# Patient Record
Sex: Female | Born: 1980 | Race: White | Hispanic: No | Marital: Single | Smoking: Never smoker
Health system: Southern US, Community
[De-identification: ages and names within clinical notes are randomized; demographics above are authoritative.]

## PROBLEM LIST (undated history)

## (undated) DIAGNOSIS — F410 Panic disorder [episodic paroxysmal anxiety] without agoraphobia: Secondary | ICD-10-CM

## (undated) DIAGNOSIS — F419 Anxiety disorder, unspecified: Secondary | ICD-10-CM

## (undated) DIAGNOSIS — F32A Depression, unspecified: Secondary | ICD-10-CM

## (undated) DIAGNOSIS — K219 Gastro-esophageal reflux disease without esophagitis: Secondary | ICD-10-CM

## (undated) DIAGNOSIS — Z872 Personal history of diseases of the skin and subcutaneous tissue: Secondary | ICD-10-CM

## (undated) DIAGNOSIS — F329 Major depressive disorder, single episode, unspecified: Secondary | ICD-10-CM

## (undated) HISTORY — DX: Depression, unspecified: F32.A

## (undated) HISTORY — DX: Personal history of diseases of the skin and subcutaneous tissue: Z87.2

## (undated) HISTORY — DX: Panic disorder (episodic paroxysmal anxiety): F41.0

## (undated) HISTORY — PX: KELOID EXCISION: SHX1856

## (undated) HISTORY — DX: Major depressive disorder, single episode, unspecified: F32.9

## (undated) HISTORY — DX: Anxiety disorder, unspecified: F41.9

## (undated) HISTORY — DX: Gastro-esophageal reflux disease without esophagitis: K21.9

---

## 2004-06-19 ENCOUNTER — Ambulatory Visit: Payer: Self-pay | Admitting: Family Medicine

## 2004-12-11 ENCOUNTER — Ambulatory Visit: Payer: Self-pay | Admitting: Family Medicine

## 2004-12-14 ENCOUNTER — Ambulatory Visit: Payer: Self-pay | Admitting: Family Medicine

## 2005-05-30 ENCOUNTER — Ambulatory Visit: Payer: Self-pay | Admitting: Specialist

## 2005-07-04 ENCOUNTER — Ambulatory Visit: Payer: Self-pay | Admitting: Specialist

## 2005-08-30 ENCOUNTER — Ambulatory Visit: Payer: Self-pay | Admitting: Family Medicine

## 2005-08-30 ENCOUNTER — Other Ambulatory Visit: Admission: RE | Admit: 2005-08-30 | Discharge: 2005-08-30 | Payer: Self-pay | Admitting: Family Medicine

## 2005-09-09 ENCOUNTER — Ambulatory Visit: Payer: Self-pay | Admitting: Family Medicine

## 2005-11-11 ENCOUNTER — Ambulatory Visit: Payer: Self-pay | Admitting: Family Medicine

## 2005-11-18 ENCOUNTER — Ambulatory Visit: Payer: Self-pay | Admitting: Family Medicine

## 2006-03-17 ENCOUNTER — Ambulatory Visit: Payer: Self-pay | Admitting: Family Medicine

## 2006-05-26 ENCOUNTER — Ambulatory Visit: Payer: Self-pay | Admitting: Family Medicine

## 2006-06-25 ENCOUNTER — Ambulatory Visit: Payer: Self-pay | Admitting: Family Medicine

## 2006-06-25 DIAGNOSIS — Z8742 Personal history of other diseases of the female genital tract: Secondary | ICD-10-CM

## 2006-06-25 LAB — CONVERTED CEMR LAB
Bacteria, UA: 0
Ketones, urine, test strip: NEGATIVE
Mucus, UA: 0
Nitrite: NEGATIVE
Protein, U semiquant: NEGATIVE
RBC / HPF: 0
Urobilinogen, UA: NEGATIVE
WBC, UA: 0 cells/hpf

## 2006-06-26 ENCOUNTER — Encounter: Payer: Self-pay | Admitting: Family Medicine

## 2006-06-27 ENCOUNTER — Encounter: Payer: Self-pay | Admitting: Family Medicine

## 2006-06-27 ENCOUNTER — Ambulatory Visit: Payer: Self-pay | Admitting: Internal Medicine

## 2006-06-27 LAB — CONVERTED CEMR LAB: Chlamydia, DNA Probe: NEGATIVE

## 2006-09-12 ENCOUNTER — Encounter: Payer: Self-pay | Admitting: Family Medicine

## 2006-09-12 DIAGNOSIS — F3289 Other specified depressive episodes: Secondary | ICD-10-CM | POA: Insufficient documentation

## 2006-09-12 DIAGNOSIS — F329 Major depressive disorder, single episode, unspecified: Secondary | ICD-10-CM

## 2006-09-12 DIAGNOSIS — K219 Gastro-esophageal reflux disease without esophagitis: Secondary | ICD-10-CM

## 2006-10-21 ENCOUNTER — Ambulatory Visit: Payer: Self-pay | Admitting: Family Medicine

## 2006-10-27 ENCOUNTER — Ambulatory Visit: Payer: Self-pay | Admitting: Family Medicine

## 2006-10-31 ENCOUNTER — Encounter: Payer: Self-pay | Admitting: Family Medicine

## 2006-11-04 ENCOUNTER — Encounter: Payer: Self-pay | Admitting: Family Medicine

## 2006-12-16 ENCOUNTER — Other Ambulatory Visit: Admission: RE | Admit: 2006-12-16 | Discharge: 2006-12-16 | Payer: Self-pay | Admitting: Family Medicine

## 2006-12-16 ENCOUNTER — Ambulatory Visit: Payer: Self-pay | Admitting: Family Medicine

## 2006-12-16 ENCOUNTER — Encounter: Payer: Self-pay | Admitting: Family Medicine

## 2006-12-16 DIAGNOSIS — F410 Panic disorder [episodic paroxysmal anxiety] without agoraphobia: Secondary | ICD-10-CM

## 2006-12-22 ENCOUNTER — Encounter (INDEPENDENT_AMBULATORY_CARE_PROVIDER_SITE_OTHER): Payer: Self-pay | Admitting: *Deleted

## 2006-12-22 LAB — CONVERTED CEMR LAB: Pap Smear: NORMAL

## 2006-12-29 ENCOUNTER — Encounter: Payer: Self-pay | Admitting: Family Medicine

## 2007-01-05 ENCOUNTER — Ambulatory Visit: Payer: Self-pay | Admitting: Family Medicine

## 2007-01-12 ENCOUNTER — Encounter: Payer: Self-pay | Admitting: Family Medicine

## 2007-01-27 ENCOUNTER — Ambulatory Visit: Payer: Self-pay | Admitting: Family Medicine

## 2007-11-20 ENCOUNTER — Encounter (INDEPENDENT_AMBULATORY_CARE_PROVIDER_SITE_OTHER): Payer: Self-pay | Admitting: *Deleted

## 2008-02-19 ENCOUNTER — Other Ambulatory Visit: Admission: RE | Admit: 2008-02-19 | Discharge: 2008-02-19 | Payer: Self-pay | Admitting: Family Medicine

## 2008-02-19 ENCOUNTER — Encounter: Payer: Self-pay | Admitting: Family Medicine

## 2008-02-19 ENCOUNTER — Ambulatory Visit: Payer: Self-pay | Admitting: Family Medicine

## 2008-02-23 ENCOUNTER — Encounter (INDEPENDENT_AMBULATORY_CARE_PROVIDER_SITE_OTHER): Payer: Self-pay | Admitting: *Deleted

## 2008-11-01 ENCOUNTER — Telehealth: Payer: Self-pay | Admitting: Family Medicine

## 2009-02-10 ENCOUNTER — Telehealth: Payer: Self-pay | Admitting: Family Medicine

## 2009-03-08 ENCOUNTER — Telehealth: Payer: Self-pay | Admitting: Family Medicine

## 2009-03-29 ENCOUNTER — Ambulatory Visit: Payer: Self-pay | Admitting: Family Medicine

## 2009-03-29 ENCOUNTER — Other Ambulatory Visit: Admission: RE | Admit: 2009-03-29 | Discharge: 2009-03-29 | Payer: Self-pay | Admitting: Family Medicine

## 2009-03-31 LAB — CONVERTED CEMR LAB
BUN: 8 mg/dL (ref 6–23)
Basophils Absolute: 0.1 10*3/uL (ref 0.0–0.1)
Cholesterol: 182 mg/dL (ref 0–200)
GFR calc non Af Amer: 158.24 mL/min (ref >60)
Glucose, Bld: 90 mg/dL (ref 70–99)
HCT: 38 % (ref 36.0–46.0)
HDL: 79.8 mg/dL (ref >39.00)
Lymphs Abs: 1.4 10*3/uL (ref 0.7–4.0)
MCV: 92.7 fL (ref 78.0–100.0)
Monocytes Absolute: 0.3 10*3/uL (ref 0.1–1.0)
Monocytes Relative: 6 % (ref 3.0–12.0)
Platelets: 226 10*3/uL (ref 150.0–400.0)
Potassium: 3.8 meq/L (ref 3.5–5.1)
RDW: 12.8 % (ref 11.5–14.6)
TSH: 0.76 microintl units/mL (ref 0.35–5.50)
Total Bilirubin: 0.5 mg/dL (ref 0.3–1.2)
VLDL: 22.6 mg/dL (ref 0.0–40.0)

## 2009-04-10 ENCOUNTER — Encounter: Payer: Self-pay | Admitting: Family Medicine

## 2009-04-10 LAB — CONVERTED CEMR LAB

## 2009-04-11 ENCOUNTER — Encounter: Payer: Self-pay | Admitting: Family Medicine

## 2009-04-11 LAB — CONVERTED CEMR LAB: Pap Smear: UNDETERMINED

## 2010-03-06 NOTE — Letter (Signed)
Summary: Results Follow up Letter  [place] [facility]  [address] [place], [place] [address]   Phone: [phone]  Fax: [phone]    04/11/2009 MRN: [id number]    [facility] [address] [place], [place]  [address]    Dear Ms. Musolf,  The following are the results of your recent test(s):  Test         Result    Pap Smear:        Normal _____  Not Normal _____ Comments:Pap smear was the same with atypical cells but no change. In light of the fact that HPV is negative can wait on next pap for one year. ______________________________________________________ Cholesterol: LDL(Bad cholesterol):         Your goal is less than:         HDL (Good cholesterol):       Your goal is more than: Comments:  ______________________________________________________ Mammogram:        Normal _____  Not Normal _____ Comments:  ___________________________________________________________________ Hemoccult:        Normal _____  Not normal _______ Comments:    _____________________________________________________________________ Other Tests:    We routinely do not discuss normal results over the telephone.  If you desire a copy of the results, or you have any questions about this information we can discuss them at your next office visit.   Sincerely,    Idamae Schuller Jarel Cuadra,MD  MT/ri

## 2010-03-06 NOTE — Progress Notes (Signed)
Summary: ORTHO TRI-CYCLEN LO 0.025 MG TABS  Phone Note Refill Request Message from:  Patient on February 10, 2009 4:02 PM  Refills Requested: Medication #1:  ORTHO TRI-CYCLEN LO 0.025 MG TABS take 1 by mouth once daily as directed Patient sayst that she has an appointment with you it Feb., want to know if she can get a refill on birth control until she comes in to see you.  Would like it sent to [facility] on [address].    Method Requested: Electronic Initial call taken by: Melody Comas,  February 10, 2009 4:04 PM Caller: Patient Call For: Kerby Nora MD  Follow-up for Phone Call        Rx called to pharmacy and patient notifed.   Follow-up by: Linde Gillis CMA Duncan Dull),  February 10, 2009 4:38 PM

## 2010-03-06 NOTE — Progress Notes (Signed)
Summary: ORTHO TRI-CYCLEN LO 0.025 MG TABS  Phone Note Refill Request Call back at Home Phone [phone] Message from:  Patient on March 08, 2009 12:03 PM  Refills Requested: Medication #1:  ORTHO TRI-CYCLEN LO 0.025 MG TABS take 1 by mouth once daily as directed Patient would like it sent to [facility] on [address].   Initial call taken by: Melody Comas,  March 08, 2009 12:04 PM  Follow-up for Phone Call        Med called to [facility] [address][phone]. One pack only. Pt has appt scheduled 03/29/09. Lewanda Rife LPN  March 08, 2009 4:51 PM

## 2010-03-06 NOTE — Assessment & Plan Note (Signed)
Summary: CPX W/PAP/RBH   Vital Signs:  Patient profile:   30 year old female Height:      65 inches Weight:      126.25 pounds BMI:     21.09 Temp:     98.6 degrees F oral Pulse rate:   80 / minute Pulse rhythm:   regular BP sitting:   132 / 84  (right arm) Cuff size:   regular  Vitals Entered By: Lewanda Rife LPN (March 29, 2009 9:26 AM)  History of Present Illness: here for health mt and gyn care   feeling good , no probls or new concerns   wt is stable with bmi of 21  bp 132/ 84  pap 1/10 normal  TD 03   periods are regular  last summer had some bad ones- better now  now not excessive cramping or heaviness  likes the ortho tri cyclen lo -- does not want to switch   wants to go ahead and do std screen with pap    takes good care of herself- diet and exercise  using her sunscreen no new family hx      Allergies: 1)  ! Sulfa 2)  ! * Adhesives  Past History:  Past Medical History: Last updated: 02/19/2008 Depression/anx/panic GERD cyst removal with scar revision (keloid) on arm     ENT- Gertie Baron   Past Surgical History: Last updated: 12/16/2006 I and D on arm for cyst/cellutitis  Family History: Last updated: 09/12/2006 Father:  Mother: MVP, allergies Siblings:   Social History: Last updated: 02/19/2008 Marital Status: single Children:  Occupation: works at [employer]  non smoker   Risk Factors: Smoking Status: never (09/12/2006)  Review of Systems General:  Denies fatigue, fever, loss of appetite, and malaise. Eyes:  Denies blurring and eye pain. CV:  Denies chest pain or discomfort, palpitations, and shortness of breath with exertion. Resp:  Denies cough and wheezing. GI:  Denies abdominal pain, bloody stools, change in bowel habits, and indigestion. GU:  Denies abnormal vaginal bleeding, discharge, dysuria, genital sores, and urinary frequency. MS:  Denies joint pain and cramps. Derm:  Denies lesion(s), poor wound  healing, and rash. Neuro:  Denies numbness and tingling. Psych:  Denies anxiety and depression. Endo:  Denies cold intolerance, excessive thirst, excessive urination, and heat intolerance. Heme:  Denies abnormal bruising and bleeding.  Physical Exam  General:  Well-developed,well-nourished,in no acute distress; alert,appropriate and cooperative throughout examination Head:  normocephalic, atraumatic, and no abnormalities observed.   Eyes:  vision grossly intact, pupils equal, pupils round, and pupils reactive to light.  no conjunctival pallor, injection or icterus  Ears:  R ear normal and L ear normal.   Nose:  no nasal discharge.   Mouth:  pharynx pink and moist.   Neck:  supple with full rom and no masses or thyromegally, no JVD or carotid bruit  Chest Wall:  No deformities, masses, or tenderness noted. Breasts:  No mass, nodules, thickening, tenderness, bulging, retraction, inflamation, nipple discharge or skin changes noted.   Lungs:  Normal respiratory effort, chest expands symmetrically. Lungs are clear to auscultation, no crackles or wheezes. Heart:  Normal rate and regular rhythm. S1 and S2 normal without gallop, murmur, click, rub or other extra sounds. Abdomen:  Bowel sounds positive,abdomen soft and non-tender without masses, organomegaly or hernias noted.  Msk:  No deformity or scoliosis noted of thoracic or lumbar spine.  no acute joint changes  Pulses:  R and L carotid,radial,femoral,dorsalis pedis and  posterior tibial pulses are full and equal bilaterally Extremities:  No clubbing, cyanosis, edema, or deformity noted with normal full range of motion of all joints.   Neurologic:  sensation intact to light touch, gait normal, and DTRs symmetrical and normal.   Skin:  Intact without suspicious lesions or rashes Cervical Nodes:  No lymphadenopathy noted Axillary Nodes:  No palpable lymphadenopathy Inguinal Nodes:  No significant adenopathy Psych:  normal affect, talkative and  pleasant    Impression & Recommendations:  Problem # 1:  HEALTH MAINTENANCE EXAM (ICD-V70.0) Assessment Comment Only  reviewed health habits including diet, exercise and skin cancer prevention reviewed health maintenance list and family history  wellness labs today Orders: Venipuncture (04540) TLB-Lipid Panel (80061-LIPID) TLB-BMP (Basic Metabolic Panel-BMET) (80048-METABOL) TLB-CBC Platelet - w/Differential (85025-CBCD) TLB-Hepatic/Liver Function Pnl (80076-HEPATIC) TLB-TSH (Thyroid Stimulating Hormone) (84443-TSH)  Problem # 2:  ROUTINE GYNECOLOGICAL EXAMINATION (ICD-V72.31) Assessment: Comment Only no change in OC - doing well disc std screen  pap today   Problem # 3:  SCREENING EXAMINATION FOR VENEREAL DISEASE (ICD-V74.5) Assessment: Comment Only no symptoms - just wants screening  add gon and chlam test to pap   Complete Medication List: 1)  Ortho Tri-cyclen Lo 0.025 Mg Tabs (Norgestimate-ethinyl estradiol) .... Take 1 by mouth once daily as directed 2)  Proventil Hfa 108 (90 Base) Mcg/act Aers (Albuterol sulfate) .... 2 puffs up to every 4 hours  as needed for wheeezing 3)  Calcium Carbonate-vitamin D 600-400 Mg-unit Tabs (Calcium carbonate-vitamin d) .... Take one twice a day as needed  Patient Instructions: 1)  no change in ortho try cyclen  2)  keep up the good work with diet and exercise  3)  use your sunscreen  4)  will update you when results return Prescriptions: ORTHO TRI-CYCLEN LO 0.025 MG TABS (NORGESTIMATE-ETHINYL ESTRADIOL) take 1 by mouth once daily as directed  #1 pack x 11   Entered and Authorized by:   Judith Part MD   Signed by:   Judith Part MD on 03/29/2009   Method used:   Print then Give to Patient   RxID:   [id number]   Current Allergies (reviewed today): ! SULFA ! * ADHESIVES

## 2010-03-06 NOTE — Miscellaneous (Signed)
Summary: pap smear screening  Clinical Lists Changes  Observations: Added new observation of PAP DUE: 04/2010 (04/11/2009 17:08) Added new observation of PAP SMEAR: ascus pap (04/10/2009 17:09)      Preventive Care Screening  Pap Smear:    Date:  04/10/2009    Next Due:  04/2010    Results:  ascus pap

## 2010-04-02 ENCOUNTER — Other Ambulatory Visit: Payer: Self-pay | Admitting: Family Medicine

## 2010-04-02 ENCOUNTER — Ambulatory Visit (INDEPENDENT_AMBULATORY_CARE_PROVIDER_SITE_OTHER)
Admission: RE | Admit: 2010-04-02 | Discharge: 2010-04-02 | Disposition: A | Discharge status: Home / Self Care | Payer: 59 | Source: Ambulatory Visit | Attending: Family Medicine | Admitting: Family Medicine

## 2010-04-02 ENCOUNTER — Other Ambulatory Visit (HOSPITAL_COMMUNITY)
Admission: RE | Admit: 2010-04-02 | Discharge: 2010-04-02 | Disposition: A | Discharge status: Home / Self Care | Payer: 59 | Source: Ambulatory Visit | Attending: Family Medicine | Admitting: Family Medicine

## 2010-04-02 ENCOUNTER — Encounter (INDEPENDENT_AMBULATORY_CARE_PROVIDER_SITE_OTHER): Payer: 59 | Admitting: Family Medicine

## 2010-04-02 ENCOUNTER — Encounter: Payer: Self-pay | Admitting: Family Medicine

## 2010-04-02 DIAGNOSIS — D492 Neoplasm of unspecified behavior of bone, soft tissue, and skin: Secondary | ICD-10-CM

## 2010-04-02 DIAGNOSIS — Z01419 Encounter for gynecological examination (general) (routine) without abnormal findings: Secondary | ICD-10-CM

## 2010-04-02 DIAGNOSIS — Z0000 Encounter for general adult medical examination without abnormal findings: Secondary | ICD-10-CM

## 2010-04-02 DIAGNOSIS — Z124 Encounter for screening for malignant neoplasm of cervix: Secondary | ICD-10-CM | POA: Insufficient documentation

## 2010-04-12 NOTE — Assessment & Plan Note (Signed)
Summary: CPX/ALC   Vital Signs:  Patient profile:   30 year old female Height:      65.5 inches Weight:      128.25 pounds BMI:     21.09 Temp:     99.4 degrees F oral Pulse rate:   88 / minute Pulse rhythm:   regular BP sitting:   130 / 82  (left arm) Cuff size:   regular  Vitals Entered By: Lewanda Rife LPN (April 02, 2010 10:44 AM) CC: CPX with pap and breast exam LMP 03/07/10   History of Present Illness: here for wellness exam and pap  is doing ok overall   has been phlemmy the last few ams - thinks she is getting a cold   has a knot on her leg - she wants me to look at --- this has been 2 mo -- did have injury there when younger is a little tender  was dx with granuloma annulare  given a steroid cream    last pap 1 year ago was ascus with neg hpv and neg std tests  not wanting std tests    on ortho tri cyclen lo- now is not affordible now  this is the only one she has been on    wt is up 2 lb with bmi of 21  130/82  bp   Td 03    Allergies: 1)  ! Sulfa 2)  ! * Adhesives  Past History:  Past Surgical History: Last updated: 12/16/2006 I and D on arm for cyst/cellutitis  Family History: Last updated: 09/12/2006 Father:  Mother: MVP, allergies Siblings:   Social History: Last updated: 02/19/2008 Marital Status: single Children:  Occupation: works at [employer]  non smoker   Risk Factors: Smoking Status: never (09/12/2006)  Past Medical History: Depression/anx/panic GERD cyst removal with scar revision (keloid) on arm  annuloma grannulare    ENT- [facility]  derm  Review of Systems General:  Denies chills, fatigue, fever, loss of appetite, and malaise. Eyes:  Denies blurring and eye irritation. ENT:  Complains of postnasal drainage; denies sinus pressure and sore throat. CV:  Denies palpitations and shortness of breath with exertion. Resp:  Denies cough, shortness of breath, and wheezing. GI:  Denies abdominal pain, change  in bowel habits, indigestion, and nausea. GU:  Denies abnormal vaginal bleeding, discharge, and dysuria. MS:  Denies muscle aches, cramps, and stiffness. Derm:  Denies itching, lesion(s), poor wound healing, and rash. Neuro:  Denies numbness and tingling. Psych:  mood is ok . Endo:  Denies cold intolerance, excessive thirst, excessive urination, and heat intolerance. Heme:  Denies abnormal bruising and bleeding.  Physical Exam  General:  Well-developed,well-nourished,in no acute distress; alert,appropriate and cooperative throughout examination Head:  normocephalic, atraumatic, and no abnormalities observed.   Eyes:  vision grossly intact, pupils equal, pupils round, and pupils reactive to light.  no conjunctival pallor, injection or icterus  Ears:  R ear normal and L ear normal.   Nose:  no nasal discharge.   Mouth:  pharynx pink and moist.   Neck:  supple with full rom and no masses or thyromegally, no JVD or carotid bruit  Chest Wall:  No deformities, masses, or tenderness noted. Breasts:  No mass, nodules, thickening, tenderness, bulging, retraction, inflamation, nipple discharge or skin changes noted.   Lungs:  Normal respiratory effort, chest expands symmetrically. Lungs are clear to auscultation, no crackles or wheezes. Heart:  Normal rate and regular rhythm. S1 and S2 normal without  gallop, murmur, click, rub or other extra sounds. Abdomen:  Bowel sounds positive,abdomen soft and non-tender without masses, organomegaly or hernias noted. no renal bruits  Genitalia:  Normal introitus for age, no external lesions, no vaginal discharge, mucosa pink and moist, no vaginal or cervical lesions, no vaginal atrophy, no friaility or hemorrhage, normal uterus size and position, no adnexal masses or tenderness Msk:  No deformity or scoliosis noted of thoracic or lumbar spine.  no acute joint changes  Pulses:  R and L carotid,radial,femoral,dorsalis pedis and posterior tibial pulses are full and  equal bilaterally Extremities:  No clubbing, cyanosis, edema, or deformity noted with normal full range of motion of all joints.   Neurologic:  sensation intact to light touch, gait normal, and DTRs symmetrical and normal.   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 2 puffs up to every 4 hours  as needed for wheeezing 2)  Vitamin C 500 Mg Tabs (Ascorbic acid) .... Take 1 tablet by mouth once a day 3)  Clobetasole Cream 0.05%  .... Apply to both legs twice weekly as needed. 4)  Aviane 0.1-20 Mg-mcg Tabs (Levonorgestrel-ethinyl estrad) .Marland Kitchen.. 1 by mouth once daily as directed  Patient Instructions: 1)  try zinc losenges otc for 3 days (I like the hall's complete)  2)  change to the aviane oral contraceptive -- give your body 3 months to adjust - but if side effects let me know  3)  we will do leg x ray on way out today  Prescriptions: AVIANE 0.1-20 MG-MCG TABS (LEVONORGESTREL-ETHINYL ESTRAD) 1  by mouth once daily as directed  #1 pack x 11   Entered and Authorized by:   Judith Part MD   Signed by:   Judith Part MD on 04/02/2010   Method used:   Electronically to        [facility] [phone]* (retail)       [address]       [place], [place]  [address]       Ph: [phone]       Fax:    RxID:   [id number]    Orders Added: 1)  Pap Smear, Thin Prep ( Collection of) [Q0091] 2)  T-Tib/Fib Left [73590TC] 3)  Prescription Created Electronically [[id number]] 4)  Est. Patient 18-39 years [99395] 5)  Est. Patient Level II [24401]    Current Allergies (reviewed today): ! SULFA ! * ADHESIVES

## 2010-05-05 ENCOUNTER — Encounter: Payer: Self-pay | Admitting: Family Medicine

## 2010-05-07 ENCOUNTER — Ambulatory Visit (INDEPENDENT_AMBULATORY_CARE_PROVIDER_SITE_OTHER): Payer: 59 | Admitting: Family Medicine

## 2010-05-07 ENCOUNTER — Encounter: Payer: Self-pay | Admitting: Family Medicine

## 2010-05-07 ENCOUNTER — Other Ambulatory Visit (HOSPITAL_COMMUNITY)
Admission: RE | Admit: 2010-05-07 | Discharge: 2010-05-07 | Disposition: A | Discharge status: Home / Self Care | Payer: 59 | Source: Ambulatory Visit | Attending: Family Medicine | Admitting: Family Medicine

## 2010-05-07 VITALS — BP 134/76 | HR 80 | Temp 99.2°F | Ht 65.5 in | Wt 127.8 lb

## 2010-05-07 DIAGNOSIS — Z01419 Encounter for gynecological examination (general) (routine) without abnormal findings: Secondary | ICD-10-CM | POA: Insufficient documentation

## 2010-05-07 DIAGNOSIS — R87619 Unspecified abnormal cytological findings in specimens from cervix uteri: Secondary | ICD-10-CM

## 2010-05-07 NOTE — Assessment & Plan Note (Signed)
Pap a year ago- ascus with neg hpv Pap last month lacked endocervical component  This is re check

## 2010-05-07 NOTE — Progress Notes (Signed)
  Subjective:    Patient ID: Veronica Andrews, female    DOB: 10/30/1980, 30 y.o.   MRN: [id number]  HPI  Here for f/u of pap -- last one neg but without endocervical component One before that was ascus without hpv Feels good  No gyn problems   New OC made her moody for a week - and then better peroids are ok   Past Medical History  Diagnosis Date  . Depression   . Anxiety   . Panic   . GERD (gastroesophageal reflux disease)   . History of keloid of skin     Past Surgical History  Procedure Date  . Keloid excision     with scar revision on arm    History   Social History  . Marital Status: Single    Spouse Name: N/A    Number of Children: N/A  . Years of Education: N/A   Occupational History  . works at [employer]    Social History Main Topics  . Smoking status: Never Smoker   . Smokeless tobacco: Not on file  . Alcohol Use: Not on file  . Drug Use: Not on file  . Sexually Active: Not on file   Other Topics Concern  . Not on file   Social History Narrative  . No narrative on file    Family History  Problem Relation Age of Onset  . Allergies Mother   . Mitral valve prolapse Mother        Review of Systems  Constitutional: Negative for fatigue and unexpected weight change.  Cardiovascular: Negative for chest pain.  Genitourinary: Negative for vaginal bleeding, vaginal discharge, difficulty urinating, vaginal pain, pelvic pain and dyspareunia.  Neurological: Negative for syncope.  Hematological: Negative for adenopathy.  Psychiatric/Behavioral: Negative for dysphoric mood.       Objective:   Physical Exam  Constitutional: She appears well-developed and well-nourished.  Abdominal: Soft. Bowel sounds are normal.       No suprapubic tenderness    Genitourinary: Vagina normal and uterus normal. No vaginal discharge found.  Neurological: She is alert.  Skin: Skin is warm and dry.  Psychiatric: She has a normal mood and affect.           Assessment & Plan:

## 2010-05-07 NOTE — Patient Instructions (Signed)
Pap today  Will update you with result

## 2011-03-07 ENCOUNTER — Other Ambulatory Visit: Payer: Self-pay | Admitting: Family Medicine

## 2011-04-09 ENCOUNTER — Ambulatory Visit (INDEPENDENT_AMBULATORY_CARE_PROVIDER_SITE_OTHER): Payer: 59 | Admitting: Family Medicine

## 2011-04-09 ENCOUNTER — Other Ambulatory Visit (HOSPITAL_COMMUNITY)
Admission: RE | Admit: 2011-04-09 | Discharge: 2011-04-09 | Disposition: A | Discharge status: Home / Self Care | Payer: 59 | Source: Ambulatory Visit | Attending: Family Medicine | Admitting: Family Medicine

## 2011-04-09 ENCOUNTER — Encounter: Payer: Self-pay | Admitting: Family Medicine

## 2011-04-09 VITALS — BP 120/80 | HR 87 | Temp 98.2°F | Ht 65.5 in | Wt 138.5 lb

## 2011-04-09 DIAGNOSIS — K921 Melena: Secondary | ICD-10-CM

## 2011-04-09 DIAGNOSIS — Z0000 Encounter for general adult medical examination without abnormal findings: Secondary | ICD-10-CM

## 2011-04-09 DIAGNOSIS — Z01419 Encounter for gynecological examination (general) (routine) without abnormal findings: Secondary | ICD-10-CM

## 2011-04-09 DIAGNOSIS — Z23 Encounter for immunization: Secondary | ICD-10-CM

## 2011-04-09 DIAGNOSIS — J309 Allergic rhinitis, unspecified: Secondary | ICD-10-CM | POA: Insufficient documentation

## 2011-04-09 DIAGNOSIS — J45909 Unspecified asthma, uncomplicated: Secondary | ICD-10-CM | POA: Insufficient documentation

## 2011-04-09 LAB — CBC WITH DIFFERENTIAL/PLATELET
Basophils Relative: 1.1 % (ref 0.0–3.0)
Eosinophils Relative: 1.8 % (ref 0.0–5.0)
HCT: 39.5 % (ref 36.0–46.0)
Hemoglobin: 13.2 g/dL (ref 12.0–15.0)
Lymphocytes Relative: 33.6 % (ref 12.0–46.0)
Lymphs Abs: 1.7 10*3/uL (ref 0.7–4.0)
Monocytes Relative: 6.8 % (ref 3.0–12.0)
Neutro Abs: 2.8 10*3/uL (ref 1.4–7.7)
RBC: 4.39 Mil/uL (ref 3.87–5.11)

## 2011-04-09 LAB — COMPREHENSIVE METABOLIC PANEL
ALT: 29 U/L (ref 0–35)
BUN: 14 mg/dL (ref 6–23)
CO2: 25 mEq/L (ref 19–32)
Calcium: 9.1 mg/dL (ref 8.4–10.5)
Chloride: 103 mEq/L (ref 96–112)
Creatinine, Ser: 0.5 mg/dL (ref 0.4–1.2)
GFR: 148.97 mL/min (ref >60.00)
Glucose, Bld: 96 mg/dL (ref 70–99)

## 2011-04-09 LAB — LIPID PANEL
Cholesterol: 179 mg/dL (ref 0–200)
Total CHOL/HDL Ratio: 2
Triglycerides: 58 mg/dL (ref 0.0–149.0)

## 2011-04-09 LAB — TSH: TSH: 0.73 u[IU]/mL (ref 0.35–5.50)

## 2011-04-09 MED ORDER — HYDROCORTISONE ACETATE 25 MG RE SUPP: 25.0000 mg | Freq: Every day | RECTAL | Status: AC

## 2011-04-09 NOTE — Progress Notes (Signed)
Subjective:    Patient ID: Veronica Andrews, female    DOB: 14-Aug-1980, 31 y.o.   MRN: [id number]  HPI Here for health maintenance exam and to review chronic medical problems  And gyn care Has been feeling ok overall   Last Friday - had some blood in stool - (has not changed diet) No pain / no abd pain / no constipation or straining  Blood is brp  When she wipes  Also a bit in water   Going to PT for shoulder and neck issues - 2 steroid injections and no help  Will f/u with ortho Was on steroids several times (made menses a little off)   Also got allergy tested (stomach issues in past )  All to cats , no food allergies  Also seasonal allergies - pollen/ ragweed and grasses  Takes advair , veramyst    Due pap  Last time -had to re check  Abn pap in past   bp 140/80- at home is normal - has whitecoat syndrome  Non smoker Wt is up 11 lb with better bmi of 22  Due Tdap Flu vaccine  On OC- doing fine  Menses - regular , last about 5 days and occ spots after , not as heavy as prior , less cramps   Patient Active Problem List  Diagnoses  . PANIC DISORDER  . DEPRESSION  . GERD  . OVARIAN CYST  . NEOPLASMS UNSPEC NATURE BONE SOFT TISSUE&SKIN  . Routine general medical examination at a health care facility  . Gynecological examination  . Allergic rhinitis  . Asthma  . Blood in stool   Past Medical History  Diagnosis Date  . Depression   . Anxiety   . Panic   . GERD (gastroesophageal reflux disease)   . History of keloid of skin    Past Surgical History  Procedure Date  . Keloid excision     with scar revision on arm   History  Substance Use Topics  . Smoking status: Never Smoker   . Smokeless tobacco: Not on file  . Alcohol Use: Not on file   Family History  Problem Relation Age of Onset  . Allergies Mother   . Mitral valve prolapse Mother    Allergies  Allergen Reactions  . Sulfonamide Derivatives     REACTION: rash   Current Outpatient  Prescriptions on File Prior to Visit  Medication Sig Dispense Refill  . albuterol (PROVENTIL HFA) 108 (90 BASE) MCG/ACT inhaler 2 puffs up to every 4 hours as needed for wheezing.       Marland Kitchen AVIANE 0.1-20 MG-MCG tablet take 1 tablet by mouth once daily as directed  28 tablet  12       Review of Systems Review of Systems  Constitutional: Negative for fever, appetite change, fatigue and unexpected weight change.  Eyes: Negative for pain and visual disturbance.  Respiratory: Negative for cough and shortness of breath.   Cardiovascular: Negative for cp or palpitations    Gastrointestinal: Negative for nausea, diarrhea and constipation. pos for brb in stool, neg for dark stool/ abd or rectal pain  Genitourinary: Negative for urgency and frequency.  Skin: Negative for pallor or rash   Neurological: Negative for weakness, light-headedness, numbness and headaches.  Hematological: Negative for adenopathy. Does not bruise/bleed easily.  Psychiatric/Behavioral: Negative for dysphoric mood. The patient is not nervous/anxious.          Objective:   Physical Exam  Constitutional: She appears well-developed and well-nourished.  No distress.  HENT:  Head: Normocephalic and atraumatic.  Right Ear: External ear normal.  Left Ear: External ear normal.  Nose: Nose normal.  Mouth/Throat: Oropharynx is clear and moist.  Eyes: Conjunctivae and EOM are normal. Pupils are equal, round, and reactive to light. No scleral icterus.  Neck: Normal range of motion. Neck supple. No JVD present. Carotid bruit is not present. No thyromegaly present.  Cardiovascular: Normal rate, regular rhythm, normal heart sounds and intact distal pulses.   No murmur heard. Pulmonary/Chest: Effort normal and breath sounds normal. No respiratory distress. She has no wheezes.  Abdominal: Soft. Bowel sounds are normal. She exhibits no distension and no mass. There is no tenderness. There is no rebound and no guarding.  Genitourinary:  Vagina normal and uterus normal. Rectal exam shows no external hemorrhoid, no fissure, no mass, no tenderness and anal tone normal. Guaiac positive stool. No breast swelling, tenderness, discharge or bleeding. There is no rash, tenderness or lesion on the right labia. There is no rash, tenderness or lesion on the left labia. Uterus is not enlarged and not tender. Cervix exhibits no motion tenderness, no discharge and no friability. Right adnexum displays no mass, no tenderness and no fullness. Left adnexum displays no mass, no tenderness and no fullness. No tenderness or bleeding around the vagina. No vaginal discharge found.       Breast exam: No mass, nodules, thickening, tenderness, bulging, retraction, inflamation, nipple discharge or skin changes noted.  No axillary or clavicular LA.  Chaperoned exam.    Lymphadenopathy:    She has no cervical adenopathy.          Assessment & Plan:

## 2011-04-09 NOTE — Assessment & Plan Note (Signed)
Had testing Cats and pollen Doing well on zyrtec and veramyst prn Sees allergies

## 2011-04-09 NOTE — Assessment & Plan Note (Signed)
Suspect due to hemorrhoids internal Given handout Avoid straining anusol hc suppos 10d If not imp will call/ f/u

## 2011-04-09 NOTE — Assessment & Plan Note (Signed)
Reviewed health habits including diet and exercise and skin cancer prevention Also reviewed health mt list, fam hx and immunizations   Tdap today Lab today

## 2011-04-09 NOTE — Assessment & Plan Note (Signed)
Annual exam with pap  OC refil Doing well  bp better on 2nd check- whitecoat

## 2011-04-09 NOTE — Assessment & Plan Note (Signed)
Due to environmental allergies  On advair- sees allergist Doing well

## 2011-04-09 NOTE — Patient Instructions (Addendum)
Use the anusol hc suppository at bedtime for 10 days for hemorrhoids Avoid straining  If bleeding does not stop - call and let me know  Continue current OC  Lab today

## 2011-04-11 ENCOUNTER — Encounter: Payer: Self-pay | Admitting: *Deleted

## 2011-04-15 ENCOUNTER — Encounter: Payer: Self-pay | Admitting: *Deleted

## 2012-03-02 ENCOUNTER — Other Ambulatory Visit: Payer: Self-pay | Admitting: Family Medicine

## 2012-03-27 ENCOUNTER — Encounter: Payer: Self-pay | Admitting: Family Medicine

## 2012-03-27 ENCOUNTER — Ambulatory Visit (INDEPENDENT_AMBULATORY_CARE_PROVIDER_SITE_OTHER): Payer: 59 | Admitting: Family Medicine

## 2012-03-27 VITALS — BP 152/84 | HR 106 | Temp 99.3°F | Ht 65.5 in | Wt 136.5 lb

## 2012-03-27 DIAGNOSIS — R319 Hematuria, unspecified: Secondary | ICD-10-CM

## 2012-03-27 DIAGNOSIS — R10A1 Flank pain, right side: Secondary | ICD-10-CM

## 2012-03-27 DIAGNOSIS — R109 Unspecified abdominal pain: Secondary | ICD-10-CM

## 2012-03-27 DIAGNOSIS — R10A0 Flank pain, unspecified side: Secondary | ICD-10-CM

## 2012-03-27 LAB — POCT URINALYSIS DIPSTICK
Bilirubin, UA: NEGATIVE
Glucose, UA: NEGATIVE
Spec Grav, UA: 1.01
pH, UA: 6

## 2012-03-27 MED ORDER — HYDROCODONE-ACETAMINOPHEN 5-325 MG PO TABS: 1.0000 | ORAL_TABLET | Freq: Four times a day (QID) | ORAL | Status: DC | PRN

## 2012-03-27 NOTE — Patient Instructions (Addendum)
Keep drinking lots of water  Take pain medicine as needed  We will schedule CT at check out  If pain becomes severe- go to the ER (or if fever or other symptoms) Will update you with result

## 2012-03-27 NOTE — Progress Notes (Signed)
Subjective:    Patient ID: Veronica Andrews, female    DOB: [birth year], 32 y.o.   MRN: [id number]  HPI Here for flank pain   Last week started to have pain in R flank and low abdomen Went to UC in [place]- and ua with blood/ nl pelvic exam Dx clincally with kidney stone  Naproxen Hydrocodone cipro flomax   Has not passed a stone at this time - thought Veronica Andrews saw some gravel in the screen once, very little   No rash Veronica Andrews has had shingles in that area in the past without a rash   No family hx of kidney stones   For a while was drinking a lot of caffine Better water intake right now   Patient Active Problem List  Diagnosis  . PANIC DISORDER  . DEPRESSION  . GERD  . OVARIAN CYST  . NEOPLASMS UNSPEC NATURE BONE SOFT TISSUE&SKIN  . Routine general medical examination at a health care facility  . Gynecological examination  . Allergic rhinitis  . Asthma  . Blood in stool   Past Medical History  Diagnosis Date  . Depression   . Anxiety   . Panic   . GERD (gastroesophageal reflux disease)   . History of keloid of skin    Past Surgical History  Procedure Laterality Date  . Keloid excision      with scar revision on arm   History  Substance Use Topics  . Smoking status: Never Smoker   . Smokeless tobacco: Not on file  . Alcohol Use: Yes     Comment: occ- beer   Family History  Problem Relation Age of Onset  . Allergies Mother   . Mitral valve prolapse Mother    Allergies  Allergen Reactions  . Adhesive (Tape)   . Sulfonamide Derivatives     REACTION: rash   Current Outpatient Prescriptions on File Prior to Visit  Medication Sig Dispense Refill  . albuterol (PROVENTIL HFA) 108 (90 BASE) MCG/ACT inhaler 2 puffs up to every 4 hours as needed for wheezing.       Marland Kitchen AVIANE 0.1-20 MG-MCG tablet take 1 tablet by mouth once daily as directed  28 each  4  . b complex vitamins tablet Take 1 tablet by mouth daily.      . fluticasone (VERAMYST) 27.5 MCG/SPRAY nasal spray  Place 2 sprays into the nose daily.      . Fluticasone-Salmeterol (ADVAIR) 100-50 MCG/DOSE AEPB Inhale 1 puff into the lungs every 12 (twelve) hours.       No current facility-administered medications on file prior to visit.      Review of Systems Review of Systems  Constitutional: Negative for fever, appetite change, fatigue and unexpected weight change.  Eyes: Negative for pain and visual disturbance.  Respiratory: Negative for cough and shortness of breath.   Cardiovascular: Negative for cp or palpitations    Gastrointestinal: Negative for nausea, diarrhea and constipation.  Genitourinary: Negative for urgency and frequency. pos for R flank pain- not severe but persistent, neg for gross blood in urine  Skin: Negative for pallor or rash   Neurological: Negative for weakness, light-headedness, numbness and headaches.  Hematological: Negative for adenopathy. Does not bruise/bleed easily.  Psychiatric/Behavioral: Negative for dysphoric mood. The patient is not nervous/anxious.         Objective:   Physical Exam  Constitutional: Veronica Andrews appears well-developed and well-nourished. No distress.  HENT:  Head: Normocephalic and atraumatic.  Mouth/Throat: Oropharynx is clear and moist.  Eyes: Conjunctivae and EOM are normal. Pupils are equal, round, and reactive to light. Right eye exhibits no discharge. Left eye exhibits no discharge. No scleral icterus.  Neck: Normal range of motion. Neck supple. No JVD present. Carotid bruit is not present. No thyromegaly present.  Cardiovascular: Normal rate, regular rhythm, normal heart sounds and intact distal pulses.   Pulmonary/Chest: Effort normal and breath sounds normal. No respiratory distress. Veronica Andrews has no wheezes. Veronica Andrews has no rales.  Abdominal: Soft. Bowel sounds are normal. Veronica Andrews exhibits no distension, no abdominal bruit and no mass. There is tenderness. There is no guarding.  Tender in R flank and CVA area without rebound or guarding   Musculoskeletal: Veronica Andrews exhibits tenderness. Veronica Andrews exhibits no edema.  R cva tenderness  Lymphadenopathy:    Veronica Andrews has no cervical adenopathy.  Neurological: Veronica Andrews is alert. Veronica Andrews has normal reflexes.  Skin: Skin is warm and dry. No rash noted. No erythema. No pallor.  Psychiatric: Veronica Andrews has a normal mood and affect.          Assessment & Plan:

## 2012-03-29 LAB — POCT UA - MICROSCOPIC ONLY
Casts, Ur, LPF, POC: 0
Crystals, Ur, HPF, POC: 0

## 2012-03-29 NOTE — Assessment & Plan Note (Signed)
S/p work up at urgent care with microscopic hematuria - presumed renal stone on the R (with pain med/ cipro and flomax) Urged to continue straining urine CT ordered  Pain med prn  Finish cipro  ua pos for blood today as well, does not look infected If worse over weekend will go to ER for eval

## 2012-03-29 NOTE — Assessment & Plan Note (Signed)
With flank pain and no signs of infx Suspect kidney stone  Ct ordered

## 2012-03-30 ENCOUNTER — Ambulatory Visit (INDEPENDENT_AMBULATORY_CARE_PROVIDER_SITE_OTHER)
Admission: RE | Admit: 2012-03-30 | Discharge: 2012-03-30 | Disposition: A | Discharge status: Home / Self Care | Payer: 59 | Source: Ambulatory Visit | Attending: Family Medicine | Admitting: Family Medicine

## 2012-03-30 ENCOUNTER — Ambulatory Visit: Payer: 59 | Admitting: Family Medicine

## 2012-03-30 DIAGNOSIS — R319 Hematuria, unspecified: Secondary | ICD-10-CM

## 2012-03-30 DIAGNOSIS — R109 Unspecified abdominal pain: Secondary | ICD-10-CM

## 2012-03-31 ENCOUNTER — Telehealth: Payer: Self-pay | Admitting: Family Medicine

## 2012-03-31 DIAGNOSIS — R319 Hematuria, unspecified: Secondary | ICD-10-CM

## 2012-03-31 MED ORDER — TAMSULOSIN HCL 0.4 MG PO CAPS: 0.4000 mg | ORAL_CAPSULE | Freq: Every day | ORAL | Status: DC

## 2012-03-31 NOTE — Telephone Encounter (Signed)
Patient called requesting Urology Referral to someone at [facility] at [phone] its close to her there. She also requests more generic Flomax .4mg  be called in to the [facility] on [address] in [place]. She lives in [place] now and we still have her parents address on file for her.

## 2012-03-31 NOTE — Telephone Encounter (Signed)
Let her know I sent flomax in to her pharmacy and Shirlee Limerick will be calling her about the referral

## 2012-03-31 NOTE — Telephone Encounter (Signed)
Patient advised.

## 2012-04-01 ENCOUNTER — Telehealth: Payer: Self-pay | Admitting: Family Medicine

## 2012-04-01 NOTE — Telephone Encounter (Signed)
Pt's mother, Shalah Estelle, arrived at window to pick up disc with medical information for pt.  There was no information in pt's chart stating that her mother could pick up medical information for her so Forest Becker at the front desk called the pt for her to verify her name, SSN, address, and phone number was correct in chart to verify she was the pt and asked for permission for her mother to pick up record.  Pt gave permission.  Forest Becker made a copy of pt's mother's driver's license and gave her the disc.

## 2012-04-02 ENCOUNTER — Telehealth: Payer: Self-pay

## 2012-04-02 MED ORDER — VALACYCLOVIR HCL 1 G PO TABS: 1000.0000 mg | ORAL_TABLET | Freq: Three times a day (TID) | ORAL | Status: DC

## 2012-04-02 NOTE — Telephone Encounter (Signed)
Pt saw urologist today and was told urologist did not see kidney stone on scan; urologist could not explain to pt cause of pain or blood in urine and gave pt f/u appt in 3 weeks to recheck urine and instructed pt to take ibuprofen for pain;urologist said  could be muscular. Pt wants Veronica Andrews opinion if thinks could be shingles; no rash but symptoms or pain was same as last time pt had shingles; pt's pain level now is 6-7.Marland KitchenPlease advise.[facility] [place]

## 2012-04-02 NOTE — Telephone Encounter (Signed)
I doubt that it is shingles- but I'm fine with a trial of valtrex to see if it helps Px written to call in  Keep me updated

## 2012-04-02 NOTE — Telephone Encounter (Signed)
Pt notified and Rx sent to pharmacy. 

## 2012-06-24 ENCOUNTER — Telehealth: Payer: Self-pay | Admitting: Family Medicine

## 2012-06-24 MED ORDER — AZITHROMYCIN 250 MG PO TABS: ORAL_TABLET | ORAL | Status: DC

## 2012-06-24 NOTE — Telephone Encounter (Signed)
OV note received and placed in your inbox

## 2012-06-24 NOTE — Telephone Encounter (Signed)
Pt notified Rx sent to pharmacy and f/u if no improvement, faxed request to [facility] in [facility] requesting OV note

## 2012-06-24 NOTE — Telephone Encounter (Signed)
Patient Information:  Caller Name: Cassidey  Phone: [phone]  Patient: Veronica Andrews, Veronica Andrews  Gender: Female  DOB: July 23, 1980  Age: 32 Years  PCP: Roxy Manns [facility])  Pregnant: No  Office Follow Up:  Does the office need to follow up with this patient?: Yes  Instructions For The Office: office, no appts available in office today.  Pt did not want to go to one of the other [facility] locations.  Would like to see if she can be worked in for appt today.  Said she will need at least 1 hour to travel to the office.  She is in [place].  PLEASE CALL PT BACK ASAP AT [phone] TO LET HER KNOW IF DR. TOWER CAN SEE HER TODAY IF AT ALL POSSIBLE.  Thanks.   Symptoms  Reason For Call & Symptoms: Pt calling today 06/24/12 regarding she went to urgent care today in [place] because she was having sinus pain and pressure and they said it was just a cold with post nasal drip.  Did not prescribe any medication.  Pt said she is afraid it will turn into bronchitis.  She is coughing up green mucus and having green nasal discharge.  Also ear pain.  However the doctor said today her ears were clear.  Reviewed Health History In EMR: Yes  Reviewed Medications In EMR: Yes  Reviewed Allergies In EMR: Yes  Reviewed Surgeries / Procedures: Yes  Date of Onset of Symptoms: 06/21/2012  Treatments Tried: Muccinex, neddi pott  Treatments Tried Worked: No OB / GYN:  LMP: 06/24/2012  Guideline(s) Used:  Sinus Pain and Congestion  Disposition Per Guideline:   See Today in Office  Reason For Disposition Reached:   [place]ache  Advice Given:  N/A  Patient Will Follow Care Advice:  YES

## 2012-06-24 NOTE — Telephone Encounter (Signed)
Let's go ahead and empirically tx for bronchitis and have her f/u if no imp  Please send for UC note Call in abx  F/u if not imp in the next week or if worse or sob

## 2012-07-03 ENCOUNTER — Encounter: Payer: Self-pay | Admitting: Radiology

## 2012-07-06 ENCOUNTER — Other Ambulatory Visit (HOSPITAL_COMMUNITY)
Admission: RE | Admit: 2012-07-06 | Discharge: 2012-07-06 | Disposition: A | Discharge status: Home / Self Care | Payer: 59 | Source: Ambulatory Visit | Attending: Family Medicine | Admitting: Family Medicine

## 2012-07-06 ENCOUNTER — Encounter: Payer: Self-pay | Admitting: Family Medicine

## 2012-07-06 ENCOUNTER — Ambulatory Visit (INDEPENDENT_AMBULATORY_CARE_PROVIDER_SITE_OTHER): Payer: 59 | Admitting: Family Medicine

## 2012-07-06 VITALS — BP 132/80 | HR 80 | Temp 98.7°F | Ht 65.5 in | Wt 135.0 lb

## 2012-07-06 DIAGNOSIS — K219 Gastro-esophageal reflux disease without esophagitis: Secondary | ICD-10-CM

## 2012-07-06 DIAGNOSIS — Z0000 Encounter for general adult medical examination without abnormal findings: Secondary | ICD-10-CM

## 2012-07-06 DIAGNOSIS — Z01419 Encounter for gynecological examination (general) (routine) without abnormal findings: Secondary | ICD-10-CM | POA: Insufficient documentation

## 2012-07-06 MED ORDER — PANTOPRAZOLE SODIUM 40 MG PO TBEC: 40.0000 mg | DELAYED_RELEASE_TABLET | Freq: Every day | ORAL | Status: DC

## 2012-07-06 MED ORDER — LEVONORGESTREL-ETHINYL ESTRAD 0.1-20 MG-MCG PO TABS: 1.0000 | ORAL_TABLET | Freq: Every day | ORAL | Status: DC

## 2012-07-06 NOTE — Assessment & Plan Note (Signed)
Reviewed health habits including diet and exercise and skin cancer prevention Also reviewed health mt list, fam hx and immunizations  Rev chol from last year-normal

## 2012-07-06 NOTE — Assessment & Plan Note (Signed)
Pt has failed omeprazole/nexium/ zantac/ pepcid and tagamet Not overwt Trial of protonix Ref to GI

## 2012-07-06 NOTE — Patient Instructions (Addendum)
See Shirlee Limerick on the way out for your GI appt  Take care of you yourself Try protonix instead of omeprazole Avoid spicy food and caffeine and carbonation

## 2012-07-06 NOTE — Progress Notes (Signed)
Subjective:    Patient ID: Veronica Andrews, female    DOB: 1980-04-14, 32 y.o.   MRN: [id number]  HPI Here for health maintenance exam and to review chronic medical problems    Having a busy summer Feeling pretty good overall  She takes omeprazole every day for GERD -not working well any more Has failed H2 blockers in the past - zantac and pepcid and tagamet and nexium otc  Burping- acid and "upset stomach/loose stool" occ -comes and goes  Was interested in seeing GI  Is not overweight - puzzled by that  Tested for food allergies-all neg No swallowing problems    Wt is down 1.5 lb with bmi of 22  bp is 142/92- she checks it at home - better (normal 120s/80s) Is stressed/ busy/ not getting enough sleep  aviane works well for her - she likes it - working well   Flu shot- got one in the fall   Pap 3/13 Menses- regular/ not heavy or painful   Td 3/13  Lab Results  Component Value Date   CHOL 179 04/09/2011   HDL 75.00 04/09/2011   LDLCALC 92 04/09/2011   TRIG 58.0 04/09/2011   CHOLHDL 2 04/09/2011     Patient Active Problem List   Diagnosis Date Noted  . Right flank pain 03/27/2012  . Hematuria 03/27/2012  . Routine general medical examination at a health care facility 04/09/2011  . Gynecological examination 04/09/2011  . Allergic rhinitis 04/09/2011  . Asthma 04/09/2011  . PANIC DISORDER 12/16/2006  . DEPRESSION 09/12/2006  . GERD 09/12/2006  . OVARIAN CYST 06/25/2006   Past Medical History  Diagnosis Date  . Depression   . Anxiety   . Panic   . GERD (gastroesophageal reflux disease)   . History of keloid of skin    Past Surgical History  Procedure Laterality Date  . Keloid excision      with scar revision on arm   History  Substance Use Topics  . Smoking status: Never Smoker   . Smokeless tobacco: Not on file  . Alcohol Use: Yes     Comment: occ- beer   Family History  Problem Relation Age of Onset  . Allergies Mother   . Mitral valve prolapse Mother     Allergies  Allergen Reactions  . Adhesive (Tape)   . Sulfonamide Derivatives     REACTION: rash   Current Outpatient Prescriptions on File Prior to Visit  Medication Sig Dispense Refill  . albuterol (PROVENTIL HFA) 108 (90 BASE) MCG/ACT inhaler 2 puffs up to every 4 hours as needed for wheezing.       Marland Kitchen AVIANE 0.1-20 MG-MCG tablet take 1 tablet by mouth once daily as directed  28 each  4  . b complex vitamins tablet Take 1 tablet by mouth daily.      . fluticasone (VERAMYST) 27.5 MCG/SPRAY nasal spray Place 2 sprays into the nose daily.      . Fluticasone-Salmeterol (ADVAIR) 100-50 MCG/DOSE AEPB Inhale 1 puff into the lungs every 12 (twelve) hours.      . vitamin C (ASCORBIC ACID) 500 MG tablet Take 500 mg by mouth daily.       No current facility-administered medications on file prior to visit.      Review of Systems Review of Systems  Constitutional: Negative for fever, appetite change, fatigue and unexpected weight change.  Eyes: Negative for pain and visual disturbance.  Respiratory: Negative for cough and shortness of breath.   Cardiovascular:  Negative for cp or palpitations    Gastrointestinal: Negative for nausea, diarrhea and constipation. pos for heartburn and indigestion Genitourinary: Negative for urgency and frequency.  Skin: Negative for pallor or rash   Neurological: Negative for weakness, light-headedness, numbness and headaches.  Hematological: Negative for adenopathy. Does not bruise/bleed easily.  Psychiatric/Behavioral: Negative for dysphoric mood. The patient is not nervous/anxious.         Objective:   Physical Exam  Constitutional: She appears well-developed and well-nourished. No distress.  HENT:  Head: Normocephalic and atraumatic.  Right Ear: External ear normal.  Left Ear: External ear normal.  Nose: Nose normal.  Mouth/Throat: Oropharynx is clear and moist.  Eyes: Conjunctivae and EOM are normal. Pupils are equal, round, and reactive to light.  Right eye exhibits no discharge. Left eye exhibits no discharge. No scleral icterus.  Neck: Normal range of motion. Neck supple. No JVD present. Carotid bruit is not present. No thyromegaly present.  Cardiovascular: Normal rate, regular rhythm, normal heart sounds and intact distal pulses.   Pulmonary/Chest: Effort normal and breath sounds normal. No respiratory distress. She has no wheezes. She has no rales.  Abdominal: Soft. Bowel sounds are normal. She exhibits no distension, no abdominal bruit and no mass. There is no tenderness.  Genitourinary: Vagina normal and uterus normal. No breast swelling, tenderness, discharge or bleeding. There is no rash, tenderness or lesion on the right labia. There is no rash, tenderness or lesion on the left labia. Uterus is not enlarged and not tender. Cervix exhibits no motion tenderness, no discharge and no friability. Right adnexum displays no mass, no tenderness and no fullness. Left adnexum displays no mass, no tenderness and no fullness. No bleeding around the vagina. No vaginal discharge found.  Breast exam: No mass, nodules, thickening, tenderness, bulging, retraction, inflamation, nipple discharge or skin changes noted.  No axillary or clavicular LA.  Chaperoned exam.    Musculoskeletal: She exhibits no edema and no tenderness.  Lymphadenopathy:    She has no cervical adenopathy.  Neurological: She is alert. She has normal reflexes. No cranial nerve deficit. She exhibits normal muscle tone. Coordination normal.  Skin: Skin is warm and dry. No rash noted. No erythema. No pallor.  Psychiatric: She has a normal mood and affect.          Assessment & Plan:

## 2012-07-06 NOTE — Assessment & Plan Note (Signed)
Annual exam No problems reported Refill OC

## 2012-07-08 LAB — HM PAP SMEAR: HM Pap smear: NORMAL

## 2012-07-09 ENCOUNTER — Encounter: Payer: Self-pay | Admitting: *Deleted

## 2012-07-09 ENCOUNTER — Encounter: Payer: Self-pay | Admitting: Family Medicine

## 2013-02-04 HISTORY — PX: KNEE SURGERY: SHX244

## 2013-06-20 ENCOUNTER — Other Ambulatory Visit: Payer: Self-pay | Admitting: Family Medicine

## 2013-06-21 NOTE — Telephone Encounter (Signed)
Electronic refill request, Rx is different brand then last prescribed, please advise

## 2013-06-21 NOTE — Telephone Encounter (Signed)
done

## 2013-06-21 NOTE — Telephone Encounter (Signed)
It is just a different generic name  Please refill for 3 months -thanks

## 2013-07-07 ENCOUNTER — Ambulatory Visit (INDEPENDENT_AMBULATORY_CARE_PROVIDER_SITE_OTHER): Payer: 59 | Admitting: Family Medicine

## 2013-07-07 ENCOUNTER — Other Ambulatory Visit (HOSPITAL_COMMUNITY)
Admission: RE | Admit: 2013-07-07 | Discharge: 2013-07-07 | Disposition: A | Discharge status: Home / Self Care | Payer: 59 | Source: Ambulatory Visit | Attending: Family Medicine | Admitting: Family Medicine

## 2013-07-07 ENCOUNTER — Encounter: Payer: Self-pay | Admitting: Family Medicine

## 2013-07-07 VITALS — BP 134/86 | HR 76 | Temp 98.7°F | Ht 65.25 in | Wt 139.5 lb

## 2013-07-07 DIAGNOSIS — Z01419 Encounter for gynecological examination (general) (routine) without abnormal findings: Secondary | ICD-10-CM

## 2013-07-07 DIAGNOSIS — Z0000 Encounter for general adult medical examination without abnormal findings: Secondary | ICD-10-CM

## 2013-07-07 DIAGNOSIS — Z1151 Encounter for screening for human papillomavirus (HPV): Secondary | ICD-10-CM | POA: Insufficient documentation

## 2013-07-07 LAB — LIPID PANEL
CHOLESTEROL: 183 mg/dL (ref 0–200)
HDL: 67.5 mg/dL (ref >39.00)
LDL CALC: 98 mg/dL (ref 0–99)
NonHDL: 115.5
Total CHOL/HDL Ratio: 3
Triglycerides: 87 mg/dL (ref 0.0–149.0)
VLDL: 17.4 mg/dL (ref 0.0–40.0)

## 2013-07-07 LAB — CBC WITH DIFFERENTIAL/PLATELET
BASOS PCT: 0.9 % (ref 0.0–3.0)
Basophils Absolute: 0.1 10*3/uL (ref 0.0–0.1)
EOS ABS: 0.1 10*3/uL (ref 0.0–0.7)
EOS PCT: 1.3 % (ref 0.0–5.0)
HEMATOCRIT: 40.1 % (ref 36.0–46.0)
Hemoglobin: 13.4 g/dL (ref 12.0–15.0)
LYMPHS ABS: 1.8 10*3/uL (ref 0.7–4.0)
Lymphocytes Relative: 31.4 % (ref 12.0–46.0)
MCHC: 33.3 g/dL (ref 30.0–36.0)
MCV: 89.1 fl (ref 78.0–100.0)
MONO ABS: 0.3 10*3/uL (ref 0.1–1.0)
Monocytes Relative: 5.5 % (ref 3.0–12.0)
NEUTROS PCT: 60.9 % (ref 43.0–77.0)
Neutro Abs: 3.6 10*3/uL (ref 1.4–7.7)
PLATELETS: 275 10*3/uL (ref 150.0–400.0)
RBC: 4.5 Mil/uL (ref 3.87–5.11)
RDW: 13.2 % (ref 11.5–15.5)
WBC: 5.9 10*3/uL (ref 4.0–10.5)

## 2013-07-07 LAB — COMPREHENSIVE METABOLIC PANEL
ALBUMIN: 4.5 g/dL (ref 3.5–5.2)
ALK PHOS: 40 U/L (ref 39–117)
ALT: 15 U/L (ref 0–35)
AST: 18 U/L (ref 0–37)
BILIRUBIN TOTAL: 0.6 mg/dL (ref 0.2–1.2)
BUN: 11 mg/dL (ref 6–23)
CO2: 25 mEq/L (ref 19–32)
Calcium: 9.4 mg/dL (ref 8.4–10.5)
Chloride: 104 mEq/L (ref 96–112)
Creatinine, Ser: 0.6 mg/dL (ref 0.4–1.2)
GFR: 122 mL/min (ref >60.00)
GLUCOSE: 96 mg/dL (ref 70–99)
POTASSIUM: 3.8 meq/L (ref 3.5–5.1)
SODIUM: 136 meq/L (ref 135–145)
Total Protein: 7.6 g/dL (ref 6.0–8.3)

## 2013-07-07 LAB — TSH: TSH: 0.42 u[IU]/mL (ref 0.35–4.50)

## 2013-07-07 NOTE — Progress Notes (Signed)
Subjective:    Patient ID: Veronica Andrews, female    DOB: Jul 22, 1980, 32 y.o.   MRN: [id number]  HPI Here for health maintenance exam and to review chronic medical problems    Has a busy summer coming up - is closing on a house next week - excited and nervous  Has weddings to go to   Feeling good overall Had knee surgery in Jan - NPFL reconstruction - ligament problem after an exercise class  Was immobile for 8 weeks  Trying to get back to normal  Riding a bike now    [employer] is up 4 lb with bmi of 23  On OC Menses- fine no problems , regular -wants to stay on that  No plans for pregnancy any time soon  Pap in 6/14-normal   Self breast exam no lumps   Flu shot 10/14 Tdap 3/13  Due for labs today   Patient Active Problem List   Diagnosis Date Noted  . Right flank pain 03/27/2012  . Hematuria 03/27/2012  . Routine general medical examination at a health care facility 04/09/2011  . Encounter for routine gynecological examination 04/09/2011  . Allergic rhinitis 04/09/2011  . Asthma 04/09/2011  . PANIC DISORDER 12/16/2006  . DEPRESSION 09/12/2006  . GERD 09/12/2006  . OVARIAN CYST 06/25/2006   Past Medical History  Diagnosis Date  . Depression   . Anxiety   . Panic   . GERD (gastroesophageal reflux disease)   . History of keloid of skin    Past Surgical History  Procedure Laterality Date  . Keloid excision      with scar revision on arm   History  Substance Use Topics  . Smoking status: Never Smoker   . Smokeless tobacco: Not on file  . Alcohol Use: Yes     Comment: occ- beer   Family History  Problem Relation Age of Onset  . Allergies Mother   . Mitral valve prolapse Mother    Allergies  Allergen Reactions  . Adhesive [Tape]   . Sulfonamide Derivatives     REACTION: rash   Current Outpatient Prescriptions on File Prior to Visit  Medication Sig Dispense Refill  . albuterol (PROVENTIL HFA) 108 (90 BASE) MCG/ACT inhaler 2 puffs up to every 4 hours  as needed for wheezing.       Marland Kitchen b complex vitamins tablet Take 1 tablet by mouth daily.      Marland Kitchen FALMINA 0.1-20 MG-MCG tablet take 1 tablet by mouth once daily  28 tablet  2  . Fluticasone-Salmeterol (ADVAIR) 100-50 MCG/DOSE AEPB Inhale 1 puff into the lungs every 12 (twelve) hours.      . Probiotic Product (PROBIOTIC DAILY PO) Take 1 tablet by mouth daily.      . vitamin C (ASCORBIC ACID) 500 MG tablet Take 500 mg by mouth daily.       No current facility-administered medications on file prior to visit.    Review of Systems    Review of Systems  Constitutional: Negative for fever, appetite change, fatigue and unexpected weight change.  Eyes: Negative for pain and visual disturbance.  Respiratory: Negative for cough and shortness of breath.   Cardiovascular: Negative for cp or palpitations    Gastrointestinal: Negative for nausea, diarrhea and constipation.  Genitourinary: Negative for urgency and frequency.  Skin: Negative for pallor or rash   Neurological: Negative for weakness, light-headedness, numbness and headaches.  Hematological: Negative for adenopathy. Does not bruise/bleed easily.  Psychiatric/Behavioral: Negative for dysphoric  mood. The patient is not nervous/anxious.      Objective:   Physical Exam  Constitutional: She appears well-developed and well-nourished. No distress.  HENT:  Head: Normocephalic and atraumatic.  Right Ear: External ear normal.  Left Ear: External ear normal.  Nose: Nose normal.  Mouth/Throat: Oropharynx is clear and moist.  Eyes: Conjunctivae and EOM are normal. Pupils are equal, round, and reactive to light. Right eye exhibits no discharge. Left eye exhibits no discharge. No scleral icterus.  Neck: Normal range of motion. Neck supple. No JVD present. No thyromegaly present.  Cardiovascular: Normal rate, regular rhythm, normal heart sounds and intact distal pulses.  Exam reveals no gallop.   Pulmonary/Chest: Effort normal and breath sounds normal.  No respiratory distress. She has no wheezes. She has no rales.  Abdominal: Soft. Bowel sounds are normal. She exhibits no distension and no mass. There is no tenderness.  Genitourinary: Vagina normal and uterus normal. No breast swelling, tenderness, discharge or bleeding. There is no rash, tenderness or lesion on the right labia. There is no rash, tenderness or lesion on the left labia. Uterus is not enlarged and not tender. Cervix exhibits no motion tenderness, no discharge and no friability. Right adnexum displays no mass, no tenderness and no fullness. Left adnexum displays no mass, no tenderness and no fullness. No vaginal discharge found.  Breast exam: No mass, nodules, thickening, tenderness, bulging, retraction, inflamation, nipple discharge or skin changes noted.  No axillary or clavicular LA.      Musculoskeletal: She exhibits no edema and no tenderness.  Lymphadenopathy:    She has no cervical adenopathy.  Neurological: She is alert. She has normal reflexes. No cranial nerve deficit. She exhibits normal muscle tone. Coordination normal.  Skin: Skin is warm and dry. No rash noted. No erythema. No pallor.  Psychiatric: She has a normal mood and affect.          Assessment & Plan:   Problem List Items Addressed This Visit     Other   Routine general medical examination at a health care facility - Primary     Reviewed health habits including diet and exercise and skin cancer prevention Reviewed appropriate screening tests for age  Also reviewed health mt list, fam hx and immunization status , as well as social and family history   Labs today  Good self care     Relevant Orders      CBC with Differential (Completed)      Comprehensive metabolic panel (Completed)      TSH (Completed)      Lipid panel (Completed)   Encounter for routine gynecological examination     Annual exam/ pap done  Refilled OC-pt does well with that No plans for pregancy in the next year     Relevant  Orders      Cytology - PAP

## 2013-07-07 NOTE — Progress Notes (Signed)
Pre visit review using our clinic review tool, if applicable. No additional management support is needed unless otherwise documented below in the visit note. 

## 2013-07-07 NOTE — Patient Instructions (Signed)
Labs today Take care of yourself

## 2013-07-08 ENCOUNTER — Encounter: Payer: Self-pay | Admitting: Family Medicine

## 2013-07-08 LAB — CYTOLOGY - PAP

## 2013-07-08 NOTE — Assessment & Plan Note (Signed)
Annual exam/ pap done  Refilled OC-pt does well with that No plans for pregancy in the next year

## 2013-07-08 NOTE — Assessment & Plan Note (Signed)
Reviewed health habits including diet and exercise and skin cancer prevention Reviewed appropriate screening tests for age  Also reviewed health mt list, fam hx and immunization status , as well as social and family history   Labs today  Good self care

## 2013-07-13 ENCOUNTER — Encounter: Payer: Self-pay | Admitting: Family Medicine

## 2013-09-11 ENCOUNTER — Other Ambulatory Visit: Payer: Self-pay | Admitting: Family Medicine

## 2013-10-05 HISTORY — PX: KNEE SURGERY: SHX244

## 2013-10-20 ENCOUNTER — Encounter: Payer: Self-pay | Admitting: Family Medicine

## 2014-02-21 ENCOUNTER — Other Ambulatory Visit: Payer: Self-pay | Admitting: Family Medicine

## 2014-04-08 ENCOUNTER — Ambulatory Visit (INDEPENDENT_AMBULATORY_CARE_PROVIDER_SITE_OTHER): Payer: 59 | Admitting: Family Medicine

## 2014-04-08 ENCOUNTER — Encounter: Payer: Self-pay | Admitting: Family Medicine

## 2014-04-08 VITALS — BP 130/90 | HR 102 | Temp 98.2°F | Ht 65.0 in | Wt 140.4 lb

## 2014-04-08 DIAGNOSIS — R002 Palpitations: Secondary | ICD-10-CM

## 2014-04-08 DIAGNOSIS — F430 Acute stress reaction: Secondary | ICD-10-CM | POA: Insufficient documentation

## 2014-04-08 MED ORDER — BUSPIRONE HCL 15 MG PO TABS: ORAL_TABLET | ORAL | Status: AC

## 2014-04-08 NOTE — Progress Notes (Signed)
Pre visit review using our clinic review tool, if applicable. No additional management support is needed unless otherwise documented below in the visit note. 

## 2014-04-08 NOTE — Patient Instructions (Signed)
Stop at check out for referral to counseling  Take care of yourself  Try buspar 1/2 pill twice daily - if side effects/ worse symptoms - stop it and let me know   We will see you in July as planned

## 2014-04-08 NOTE — Progress Notes (Signed)
Subjective:    Patient ID: Veronica Andrews, female    DOB: Apr 01, 1980, 34 y.o.   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) - taking on a lot  People involved in all of that  Had 3 knee surgeries in 2015 - cannot work out yet-that is difficult   She talks to her mother -good support Also her fiancee is very understanding   EKG today NSR with rate of 76  Thinks her asthma is well controlled right now  Use her inhaler if it is very cold outside or with worse seasonal allergies in the fall (albuterol)-had not used in a while  Has advair every other day  flonase as needed       BP Readings from Last 3 Encounters:  04/08/14 152/90  07/07/13 134/86  07/06/12 132/80    Lab Results  Component Value Date   TSH 0.42 07/07/2013      Chemistry      Component Value Date/Time   NA 136 07/07/2013 1101   K 3.8 07/07/2013 1101   CL 104 07/07/2013 1101   CO2 25 07/07/2013 1101   BUN 11 07/07/2013 1101   CREATININE 0.6 07/07/2013 1101      Component Value Date/Time   CALCIUM 9.4 07/07/2013 1101   ALKPHOS 40 07/07/2013 1101   AST 18 07/07/2013 1101   ALT 15 07/07/2013 1101   BILITOT 0.6 07/07/2013 1101      Lab Results  Component Value Date   WBC 5.9 07/07/2013   HGB 13.4 07/07/2013   HCT 40.1 07/07/2013   MCV 89.1 07/07/2013   PLT 275.0 07/07/2013     Patient Active Problem List   Diagnosis Date Noted  . Palpitations 04/08/2014  . Stress reaction 04/08/2014  . Routine general  medical examination at a health care facility 04/09/2011  . Encounter for routine gynecological examination 04/09/2011  . Allergic rhinitis 04/09/2011  . Asthma 04/09/2011  . PANIC DISORDER 12/16/2006  . DEPRESSION 09/12/2006  . GERD 09/12/2006  . History of ovarian cyst 06/25/2006   Past Medical History  Diagnosis Date  . Depression   . Anxiety   . Panic   . GERD (gastroesophageal reflux disease)   . History of keloid of skin    Past Surgical History  Procedure Laterality Date  . Keloid excision      with scar revision on arm  . Knee surgery Left 1/15    NPF ligament repair  . Knee surgery  9/15    lysis of adhesions   History  Substance Use Topics  . Smoking status: Never Smoker   . Smokeless tobacco: Not on file  . Alcohol Use: Yes     Comment: occ- beer   Family History  Problem Relation Age of Onset  . Allergies Mother   . Mitral valve prolapse Mother    Allergies  Allergen Reactions  . Adhesive [Tape]   . Sulfonamide  Derivatives     REACTION: rash   Current Outpatient Prescriptions on File Prior to Visit  Medication Sig Dispense Refill  . albuterol (PROVENTIL HFA) 108 (90 BASE) MCG/ACT inhaler 2 puffs up to every 4 hours as needed for wheezing.     Marland Kitchen b complex vitamins tablet Take 1 tablet by mouth daily.    Marland Kitchen FALMINA 0.1-20 MG-MCG tablet take 1 tablet by mouth once daily 28 tablet 5  . Fluticasone-Salmeterol (ADVAIR) 100-50 MCG/DOSE AEPB Inhale 1 puff into the lungs every 12 (twelve) hours.    . naproxen (NAPROSYN) 500 MG tablet Take 500 mg by mouth 2 (two) times daily as needed.     . Probiotic Product (PROBIOTIC DAILY PO) Take 1 tablet by mouth daily.    . RABEprazole (ACIPHEX) 20 MG tablet Take 20 mg by mouth 2 (two) times daily.    . vitamin C (ASCORBIC ACID) 500 MG tablet Take 500 mg by mouth daily.     No current facility-administered medications on file prior to visit.    Review of Systems Review of Systems  Constitutional: Negative for fever,  appetite change,  and unexpected weight change.  Eyes: Negative for pain and visual disturbance.  Respiratory: Negative for cough and shortness of breath.   Cardiovascular: Negative for PND/ orthopnea/leg swelling or pleuritic cp Gastrointestinal: Negative for nausea, diarrhea and constipation. neg for abd pain or heartburn  Genitourinary: Negative for urgency and frequency.  Skin: Negative for pallor or rash   Neurological: Negative for weakness, light-headedness, numbness and headaches.  Hematological: Negative for adenopathy. Does not bruise/bleed easily.  Psychiatric/Behavioral: Negative for dysphoric mood. Pos for anxiety and sens of being overwhelmed        Objective:   Physical Exam  Constitutional: She appears well-developed and well-nourished. No distress.  HENT:  Head: Normocephalic and atraumatic.  Mouth/Throat: Oropharynx is clear and moist.  Eyes: Conjunctivae and EOM are normal. Pupils are equal, round, and reactive to light. No scleral icterus.  Neck: Normal range of motion. Neck supple. No JVD present. Carotid bruit is not present. No thyromegaly present.  Cardiovascular: Regular rhythm, normal heart sounds and intact distal pulses.  Exam reveals no gallop.   No murmur heard. Pulmonary/Chest: Effort normal and breath sounds normal. No respiratory distress. She has no wheezes. She has no rales. She exhibits no tenderness.  Abdominal: Soft. Bowel sounds are normal. She exhibits no distension and no mass. There is no tenderness.  Musculoskeletal: She exhibits no edema or tenderness.  Lymphadenopathy:    She has no cervical adenopathy.  Neurological: She is alert. She has normal reflexes. She displays no tremor. No cranial nerve deficit. She exhibits normal muscle tone. Coordination normal.  Skin: Skin is warm and dry. No rash noted. No erythema. No pallor.  Psychiatric: Her speech is normal and behavior is normal. Thought content normal. Her mood appears anxious. Her affect  is not blunt, not labile and not inappropriate. Cognition and memory are normal. She does not exhibit a depressed mood.          Assessment & Plan:   Problem List Items Addressed This Visit      Other   Palpitations    EKG NSR with rate in 70s Suspect that this is anxiety related Disc limiting caffeine  If no imp with imp of her anxiety-will update      Relevant Orders   EKG 12-Lead (Completed)   Stress reaction - Primary    Pt disc stressors freely Reviewed stressors/  coping techniques/symptoms/ support sources/ tx options and side effects in detail today  Will try buspar 7.5 mg bid Discussed expectations of SSRI medication including time to effectiveness and mechanism of action, also poss of side effects (early and late)- including mental fuzziness, weight or appetite change, nausea and poss of worse dep or anxiety (even suicidal thoughts)  Pt voiced understanding and will stop med and update if this occurs   Ref to counseling  Enc exercise  F/u planned  >25 minutes spent in face to face time with patient, >50% spent in counselling or coordination of care       Relevant Orders   Ambulatory referral to Psychology

## 2014-04-10 NOTE — Assessment & Plan Note (Signed)
EKG NSR with rate in 70s Suspect that this is anxiety related Disc limiting caffeine  If no imp with imp of her anxiety-will update

## 2014-04-10 NOTE — Assessment & Plan Note (Addendum)
Pt disc stressors freely Reviewed stressors/ coping techniques/symptoms/ support sources/ tx options and side effects in detail today  Will try buspar 7.5 mg bid Discussed expectations of SSRI medication including time to effectiveness and mechanism of action, also poss of side effects (early and late)- including mental fuzziness, weight or appetite change, nausea and poss of worse dep or anxiety (even suicidal thoughts)  Pt voiced understanding and will stop med and update if this occurs   Ref to counseling  Enc exercise  F/u planned  >25 minutes spent in face to face time with patient, >50% spent in counselling or coordination of care

## 2014-08-09 ENCOUNTER — Encounter: Payer: Self-pay | Admitting: Family Medicine

## 2014-08-14 ENCOUNTER — Other Ambulatory Visit: Payer: Self-pay | Admitting: Family Medicine

## 2015-02-14 IMAGING — CT CT ABD-PELV W/O CM
2 of 4 series · 17 of 46 positions shown, 19 images · non-contrast
Comparison: 06/27/2006

CLINICAL DATA: Right flank pain.  Hematuria.

CT ABDOMEN AND PELVIS WITHOUT CONTRAST
TECHNIQUE: Multidetector CT imaging of the abdomen and pelvis was
performed following the standard protocol without intravenous
contrast.

[Series 2: 5mm stone study - 160 eff. mas · axial · 0.58mm/px · z∈[-439,-39]mm · 14 of 88 slices shown, 16 images]
[im 4/88  soft-tissue]
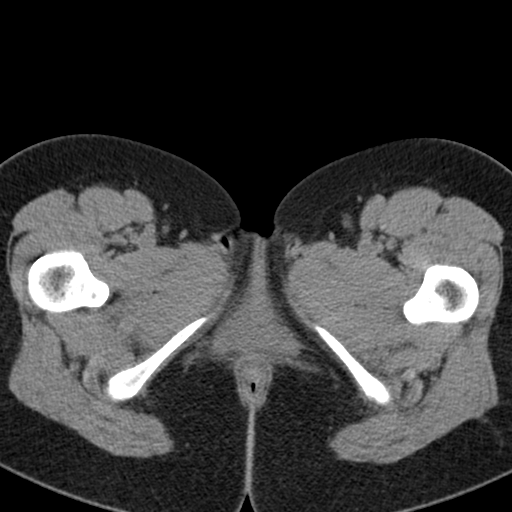
[im 4/88  bone]
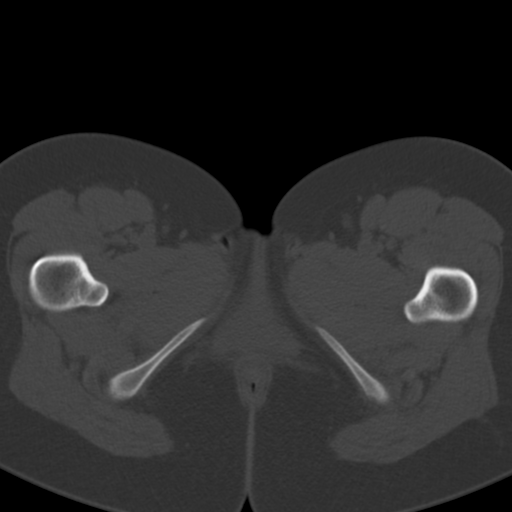
[im 11/88  soft-tissue]
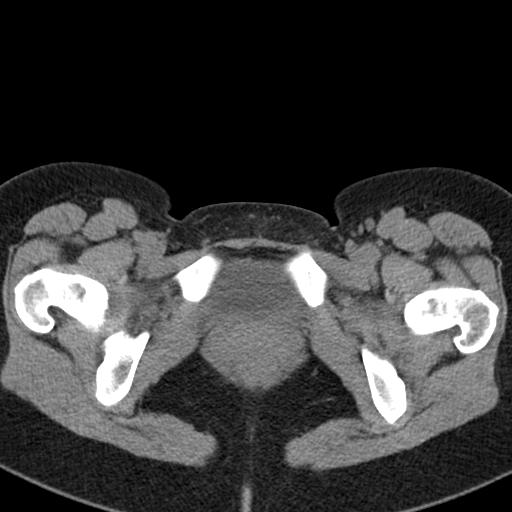
[im 17/88  soft-tissue]
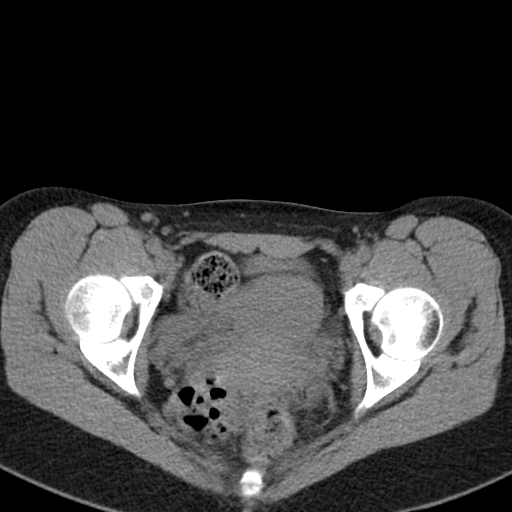
[im 24/88  soft-tissue]
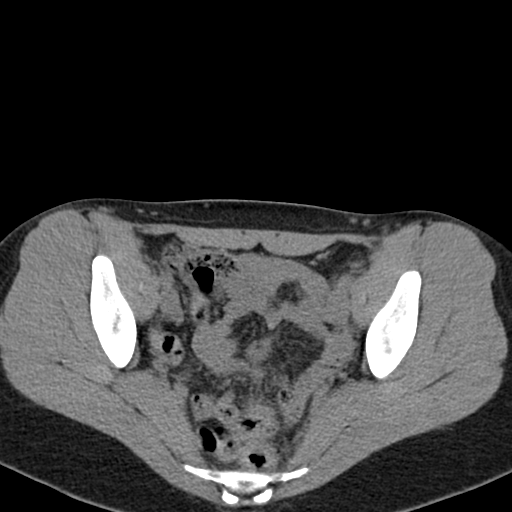
[im 31/88  soft-tissue]
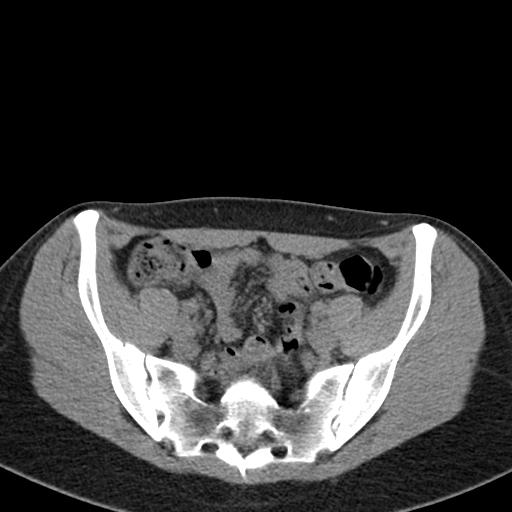
[im 34/88  soft-tissue]
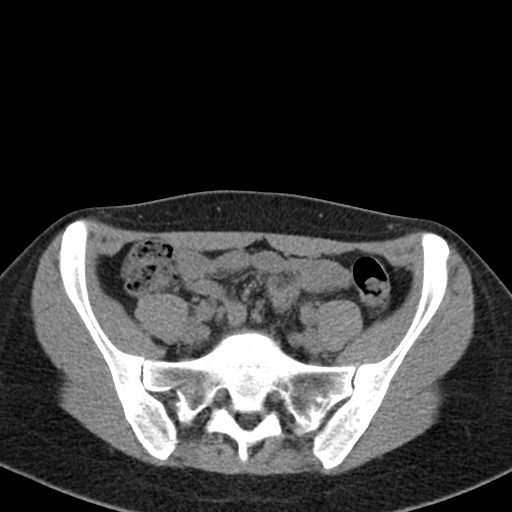
[im 41/88  soft-tissue]
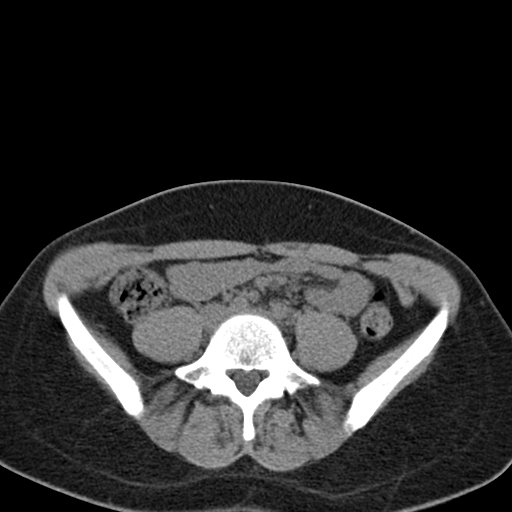
[im 47/88  soft-tissue]
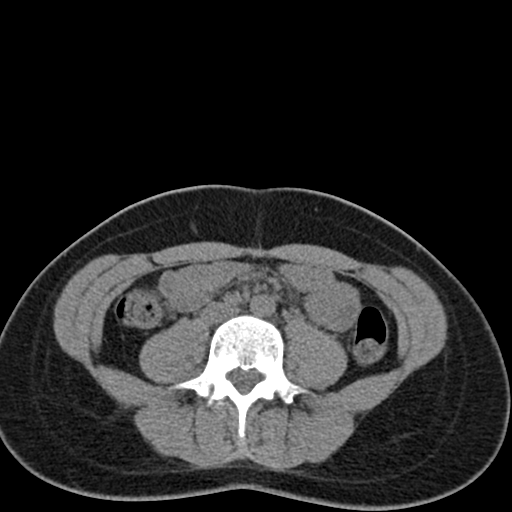
[im 54/88  soft-tissue]
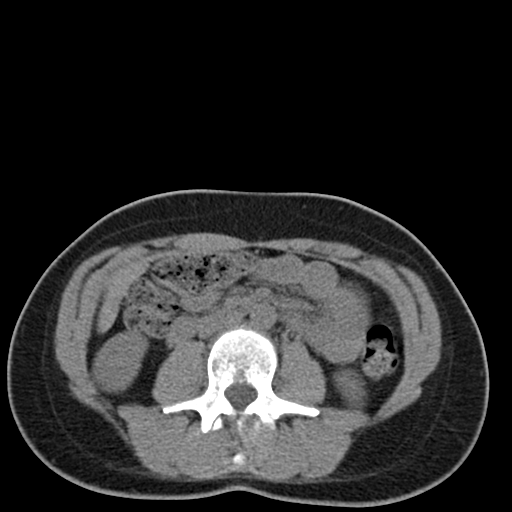
[im 54/88  bone]
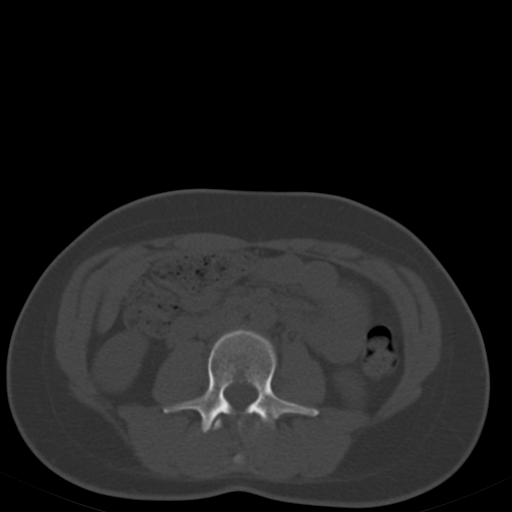
[im 57/88  soft-tissue]
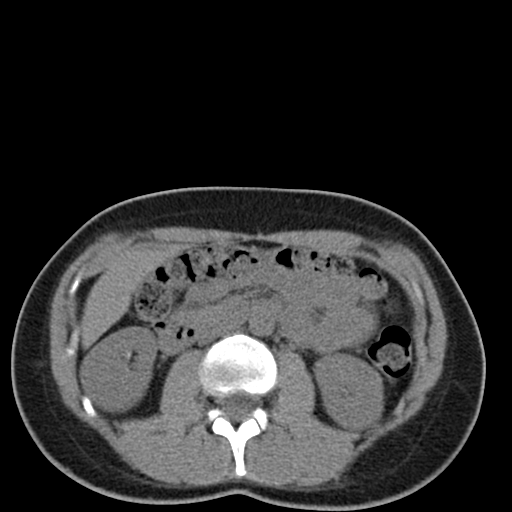
[im 64/88  soft-tissue]
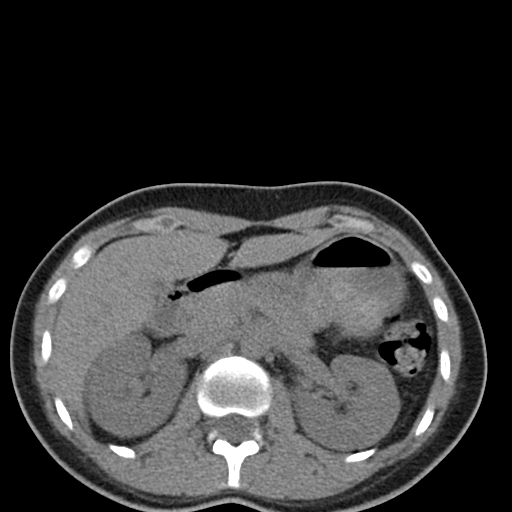
[im 71/88  soft-tissue]
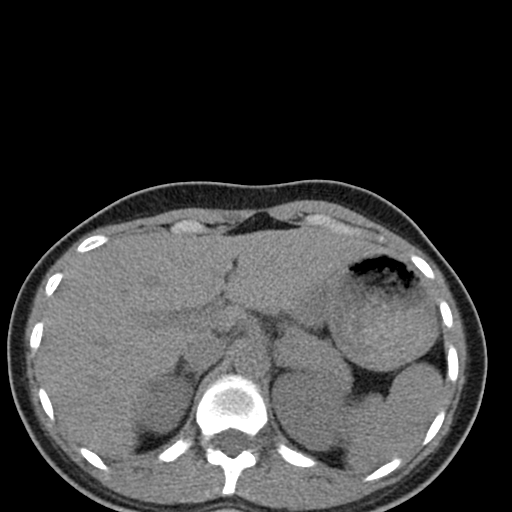
[im 77/88  soft-tissue]
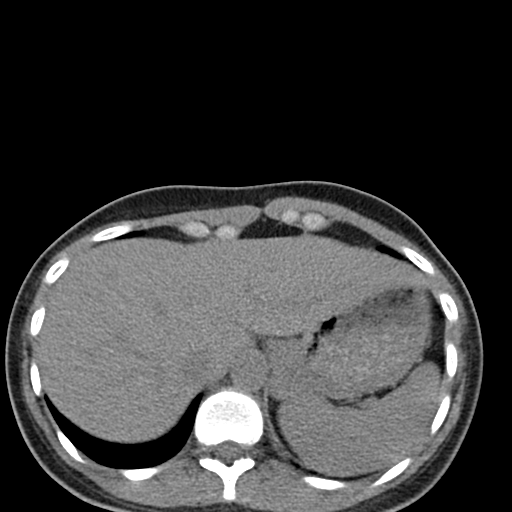
[im 84/88  soft-tissue]
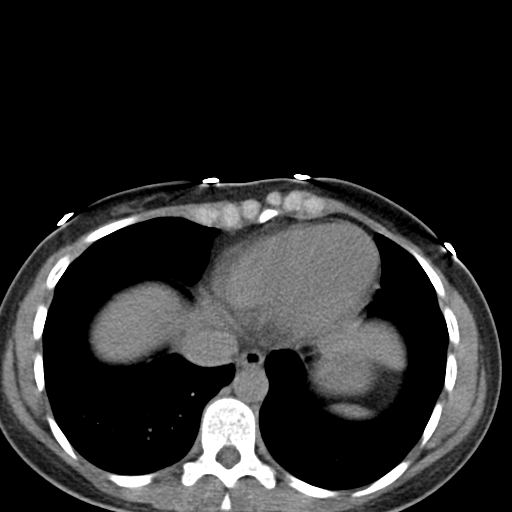

[Series 602: cor · coronal · 0.89mm/px · 3 of 88 slices shown]
[im 30/88  soft-tissue]
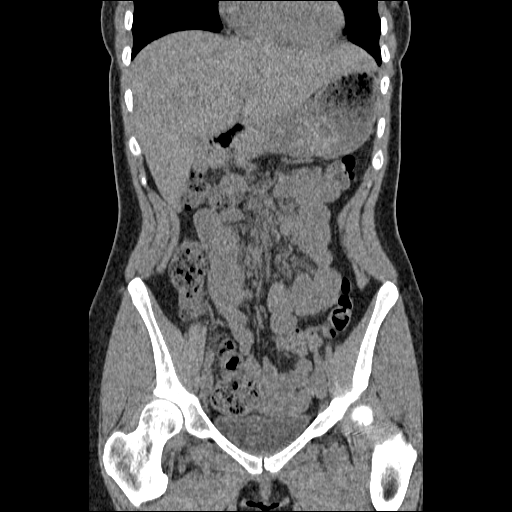
[im 39/88  soft-tissue]
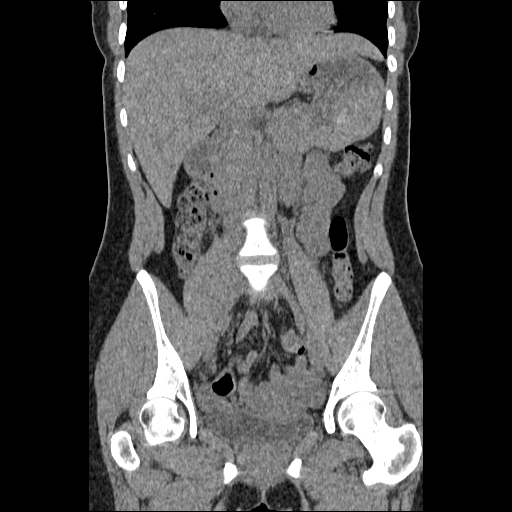
[im 49/88  soft-tissue]
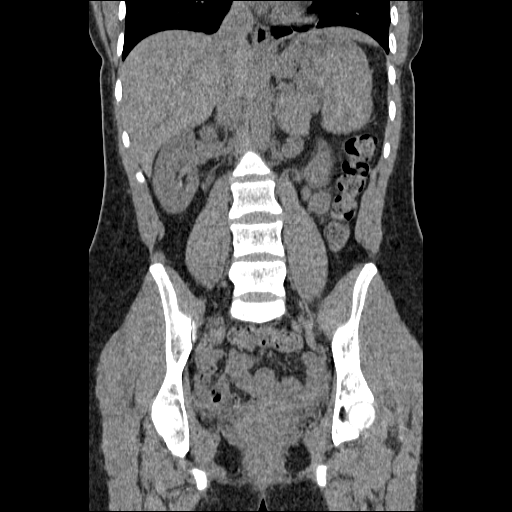

[17 of 46 positions shown; findings below may reference images not displayed]

FINDINGS: The lung bases are clear.  No pericardial or pleural
effusion.

No focal liver abnormalities identified.  The gallbladder appears
normal.  There is no biliary dilatation.  Normal appearance of the
pancreas.  The spleen appears within normal limits.

The adrenal glands are both normal.  Nonobstructing stone within
the upper pole the right kidney measures 5 mm, image 56/series 602.
Punctate stone is identified within the inferior pole of the right
kidney measuring 2 mm, image 32/series 2.  The left kidney appears
normal.  There is no hydronephrosis or nephrolithiasis.  The
urinary bladder appears normal.  Uterus and the adnexal structures
are unremarkable.

The abdominal aorta has a normal caliber.  No evidence for
aneurysm.  There is no upper abdominal or pelvic adenopathy.

The stomach and the small bowel loops appear normal in course and
caliber.  No obstruction.  There is a normal appearance of the
colon.

Review of the visualized bony structures is unremarkable.
IMPRESSION: 1.  Nonobstructing right renal calculi.
2.  No acute findings.

## 2015-02-17 ENCOUNTER — Encounter: Payer: Self-pay | Admitting: Family Medicine
